# Patient Record
Sex: Female | Born: 1975
Health system: Southern US, Community
[De-identification: ages and names within clinical notes are randomized; demographics above are authoritative.]

---

## 2002-06-27 ENCOUNTER — Other Ambulatory Visit: Admission: RE | Admit: 2002-06-27 | Discharge: 2002-06-27 | Payer: Self-pay | Admitting: *Deleted

## 2003-06-17 ENCOUNTER — Other Ambulatory Visit: Admission: RE | Admit: 2003-06-17 | Discharge: 2003-06-17 | Payer: Self-pay | Admitting: *Deleted

## 2005-07-25 ENCOUNTER — Ambulatory Visit (HOSPITAL_COMMUNITY): Admission: RE | Admit: 2005-07-25 | Discharge: 2005-07-25 | Payer: Self-pay | Admitting: Orthopedic Surgery

## 2006-02-13 ENCOUNTER — Ambulatory Visit (HOSPITAL_COMMUNITY): Admission: RE | Admit: 2006-02-13 | Discharge: 2006-02-13 | Payer: Self-pay | Admitting: Orthopedic Surgery

## 2006-12-04 ENCOUNTER — Ambulatory Visit (HOSPITAL_COMMUNITY): Admission: RE | Admit: 2006-12-04 | Discharge: 2006-12-04 | Payer: Self-pay | Admitting: *Deleted

## 2006-12-04 ENCOUNTER — Encounter (INDEPENDENT_AMBULATORY_CARE_PROVIDER_SITE_OTHER): Payer: Self-pay | Admitting: Specialist

## 2007-07-01 ENCOUNTER — Emergency Department (HOSPITAL_COMMUNITY): Admission: EM | Admit: 2007-07-01 | Discharge: 2007-07-01 | Payer: Self-pay | Admitting: Emergency Medicine

## 2011-02-11 NOTE — Op Note (Signed)
Pamela Parker, Pamela Parker               ACCOUNT NO.:  [id number]   MEDICAL RECORD NO.:  [id number]          PATIENT TYPE:  AMB   LOCATION:  SDC                           FACILITY:  [facility]   PHYSICIAN:  [place] B. Earlene Plater, M.D.  DATE OF BIRTH:  [birth year]   DATE OF PROCEDURE:  12/04/2006  DATE OF DISCHARGE:                               OPERATIVE REPORT   PREOPERATIVE DIAGNOSIS:  Redundant left labial tissue.   POSTOPERATIVE DIAGNOSIS:  Redundant left labial tissue.   PROCEDURE:  Excision of redundant labial tissue.   SURGEON:  Chester Holstein. Earlene Plater, M.D.   ASSISTANT:  None.   ANESTHESIA:  MAC and 5 cc 0.25% Marcaine.   SPECIMENS:  Labial tissue to pathology.   ESTIMATED BLOOD LOSS:  Minimal.   COMPLICATIONS:  None.   INDICATIONS:  A patient with long history of redundant labial tissue.  This makes it difficult regarding hygiene, with toilet paper often  sticking, difficulty wearing tight fitting clothes causing significant  personal discomfort.  The patient is advised that excision of redundant  labial tissue is purely to reduce bulk of the tissue, and that the side  will not look the same as the right (which is unaltered). The patient is  aware of this and also of the possibility of dyspareunia.  The patient  is advised of the risks of surgery, including infection, bleeding ,  damage to surrounding organs and delayed healing.   DESCRIPTION OF PROCEDURE:  The patient was taken to the operating room  and MAC anesthesia obtained.  The labia are inspected.  The left labia  extended a good 2-3 cm distal as compared to the right.  The two labia  are placed on traction, and a marking pen used to mark the left labia to  approximate the configuration on the right.  This area was infiltrated  with 0.25% Marcaine and excised sharply.  The incision was closed on a  subcuticular stitch of 4-0 Vicryl, with hemostasis obtained.   The patient tolerated the procedure well with no complications.  She  was  taken to the recovery room awake, alert and in stable condition.  All  counts were correct per operating room staff.      Gerri Spore B. Earlene Plater, M.D.  Electronically Signed     WBD/MEDQ  D:  12/04/2006  T:  12/04/2006  Job:  [id number]

## 2011-07-07 LAB — URINALYSIS, ROUTINE W REFLEX MICROSCOPIC
Bilirubin Urine: NEGATIVE
Glucose, UA: NEGATIVE
Ketones, ur: NEGATIVE
Leukocytes, UA: NEGATIVE
Nitrite: NEGATIVE
Protein, ur: NEGATIVE
Specific Gravity, Urine: 1.014
Urobilinogen, UA: 0.2
pH: 7.5

## 2011-07-07 LAB — URINE MICROSCOPIC-ADD ON

## 2011-07-07 LAB — PREGNANCY, URINE: Preg Test, Ur: NEGATIVE

## 2014-01-02 ENCOUNTER — Other Ambulatory Visit (HOSPITAL_COMMUNITY): Payer: Self-pay | Admitting: Obstetrics

## 2014-01-02 DIAGNOSIS — N970 Female infertility associated with anovulation: Secondary | ICD-10-CM

## 2014-01-06 ENCOUNTER — Ambulatory Visit (HOSPITAL_COMMUNITY)
Admission: RE | Admit: 2014-01-06 | Discharge: 2014-01-06 | Disposition: A | Discharge status: Home / Self Care | Payer: 59 | Source: Ambulatory Visit | Attending: Obstetrics | Admitting: Obstetrics

## 2014-01-06 DIAGNOSIS — N970 Female infertility associated with anovulation: Secondary | ICD-10-CM

## 2014-01-06 DIAGNOSIS — N979 Female infertility, unspecified: Secondary | ICD-10-CM | POA: Insufficient documentation

## 2014-01-06 MED ORDER — IOHEXOL 300 MG/ML  SOLN
20.0000 mL | Freq: Once | INTRAMUSCULAR | Status: AC | PRN
  Administered 2014-01-06: 5 mL

## 2016-10-30 DIAGNOSIS — J209 Acute bronchitis, unspecified: Secondary | ICD-10-CM | POA: Diagnosis not present

## 2016-11-25 DIAGNOSIS — Z0000 Encounter for general adult medical examination without abnormal findings: Secondary | ICD-10-CM | POA: Diagnosis not present

## 2017-01-30 DIAGNOSIS — Z880 Allergy status to penicillin: Secondary | ICD-10-CM | POA: Diagnosis not present

## 2017-01-30 DIAGNOSIS — Z9889 Other specified postprocedural states: Secondary | ICD-10-CM | POA: Diagnosis not present

## 2017-01-30 DIAGNOSIS — Q2731 Arteriovenous malformation of vessel of upper limb: Secondary | ICD-10-CM | POA: Diagnosis not present

## 2017-01-30 DIAGNOSIS — Z8774 Personal history of (corrected) congenital malformations of heart and circulatory system: Secondary | ICD-10-CM | POA: Diagnosis not present

## 2017-01-30 DIAGNOSIS — M79641 Pain in right hand: Secondary | ICD-10-CM | POA: Diagnosis not present

## 2017-02-10 DIAGNOSIS — Z8774 Personal history of (corrected) congenital malformations of heart and circulatory system: Secondary | ICD-10-CM | POA: Diagnosis not present

## 2017-02-27 DIAGNOSIS — Z8774 Personal history of (corrected) congenital malformations of heart and circulatory system: Secondary | ICD-10-CM | POA: Diagnosis not present

## 2017-02-27 DIAGNOSIS — Z09 Encounter for follow-up examination after completed treatment for conditions other than malignant neoplasm: Secondary | ICD-10-CM | POA: Diagnosis not present

## 2017-03-01 DIAGNOSIS — Z8774 Personal history of (corrected) congenital malformations of heart and circulatory system: Secondary | ICD-10-CM | POA: Diagnosis not present

## 2017-05-01 DIAGNOSIS — K130 Diseases of lips: Secondary | ICD-10-CM | POA: Diagnosis not present

## 2017-05-01 DIAGNOSIS — D2262 Melanocytic nevi of left upper limb, including shoulder: Secondary | ICD-10-CM | POA: Diagnosis not present

## 2017-05-01 DIAGNOSIS — D239 Other benign neoplasm of skin, unspecified: Secondary | ICD-10-CM | POA: Diagnosis not present

## 2017-05-15 DIAGNOSIS — R946 Abnormal results of thyroid function studies: Secondary | ICD-10-CM | POA: Diagnosis not present

## 2017-05-19 DIAGNOSIS — E039 Hypothyroidism, unspecified: Secondary | ICD-10-CM | POA: Diagnosis not present

## 2017-05-19 DIAGNOSIS — Z8349 Family history of other endocrine, nutritional and metabolic diseases: Secondary | ICD-10-CM | POA: Diagnosis not present

## 2017-05-19 DIAGNOSIS — R6889 Other general symptoms and signs: Secondary | ICD-10-CM | POA: Diagnosis not present

## 2017-05-30 DIAGNOSIS — Z6821 Body mass index (BMI) 21.0-21.9, adult: Secondary | ICD-10-CM | POA: Diagnosis not present

## 2017-05-30 DIAGNOSIS — Z01419 Encounter for gynecological examination (general) (routine) without abnormal findings: Secondary | ICD-10-CM | POA: Diagnosis not present

## 2017-07-06 DIAGNOSIS — E039 Hypothyroidism, unspecified: Secondary | ICD-10-CM | POA: Diagnosis not present

## 2017-08-30 DIAGNOSIS — S29012A Strain of muscle and tendon of back wall of thorax, initial encounter: Secondary | ICD-10-CM | POA: Diagnosis not present

## 2017-08-30 DIAGNOSIS — M898X1 Other specified disorders of bone, shoulder: Secondary | ICD-10-CM | POA: Diagnosis not present

## 2017-08-30 DIAGNOSIS — R918 Other nonspecific abnormal finding of lung field: Secondary | ICD-10-CM | POA: Diagnosis not present

## 2017-09-01 DIAGNOSIS — E039 Hypothyroidism, unspecified: Secondary | ICD-10-CM | POA: Diagnosis not present

## 2017-09-05 DIAGNOSIS — R140 Abdominal distension (gaseous): Secondary | ICD-10-CM | POA: Diagnosis not present

## 2017-09-05 DIAGNOSIS — E039 Hypothyroidism, unspecified: Secondary | ICD-10-CM | POA: Diagnosis not present

## 2017-09-22 DIAGNOSIS — Z803 Family history of malignant neoplasm of breast: Secondary | ICD-10-CM | POA: Diagnosis not present

## 2017-09-22 DIAGNOSIS — R140 Abdominal distension (gaseous): Secondary | ICD-10-CM | POA: Diagnosis not present

## 2017-09-22 DIAGNOSIS — Z131 Encounter for screening for diabetes mellitus: Secondary | ICD-10-CM | POA: Diagnosis not present

## 2017-09-22 DIAGNOSIS — Z0000 Encounter for general adult medical examination without abnormal findings: Secondary | ICD-10-CM | POA: Diagnosis not present

## 2017-10-20 DIAGNOSIS — R140 Abdominal distension (gaseous): Secondary | ICD-10-CM | POA: Diagnosis not present

## 2017-11-06 DIAGNOSIS — R1010 Upper abdominal pain, unspecified: Secondary | ICD-10-CM | POA: Diagnosis not present

## 2017-11-06 DIAGNOSIS — R112 Nausea with vomiting, unspecified: Secondary | ICD-10-CM | POA: Diagnosis not present

## 2017-11-20 DIAGNOSIS — Z0000 Encounter for general adult medical examination without abnormal findings: Secondary | ICD-10-CM | POA: Diagnosis not present

## 2017-11-20 DIAGNOSIS — E039 Hypothyroidism, unspecified: Secondary | ICD-10-CM | POA: Diagnosis not present

## 2017-11-23 DIAGNOSIS — K5900 Constipation, unspecified: Secondary | ICD-10-CM | POA: Diagnosis not present

## 2017-11-23 DIAGNOSIS — R140 Abdominal distension (gaseous): Secondary | ICD-10-CM | POA: Diagnosis not present

## 2017-11-30 DIAGNOSIS — Z0000 Encounter for general adult medical examination without abnormal findings: Secondary | ICD-10-CM | POA: Diagnosis not present

## 2017-11-30 DIAGNOSIS — E039 Hypothyroidism, unspecified: Secondary | ICD-10-CM | POA: Diagnosis not present

## 2017-12-19 DIAGNOSIS — R140 Abdominal distension (gaseous): Secondary | ICD-10-CM | POA: Diagnosis not present

## 2017-12-19 DIAGNOSIS — K5900 Constipation, unspecified: Secondary | ICD-10-CM | POA: Diagnosis not present

## 2018-01-30 DIAGNOSIS — E039 Hypothyroidism, unspecified: Secondary | ICD-10-CM | POA: Diagnosis not present

## 2018-06-12 DIAGNOSIS — L239 Allergic contact dermatitis, unspecified cause: Secondary | ICD-10-CM | POA: Diagnosis not present

## 2018-06-12 DIAGNOSIS — R21 Rash and other nonspecific skin eruption: Secondary | ICD-10-CM | POA: Diagnosis not present

## 2018-06-12 DIAGNOSIS — J309 Allergic rhinitis, unspecified: Secondary | ICD-10-CM | POA: Diagnosis not present

## 2018-08-28 DIAGNOSIS — Z01419 Encounter for gynecological examination (general) (routine) without abnormal findings: Secondary | ICD-10-CM | POA: Diagnosis not present

## 2018-08-28 DIAGNOSIS — Z6821 Body mass index (BMI) 21.0-21.9, adult: Secondary | ICD-10-CM | POA: Diagnosis not present

## 2018-08-28 DIAGNOSIS — Z1231 Encounter for screening mammogram for malignant neoplasm of breast: Secondary | ICD-10-CM | POA: Diagnosis not present

## 2018-09-11 DIAGNOSIS — D259 Leiomyoma of uterus, unspecified: Secondary | ICD-10-CM | POA: Diagnosis not present

## 2018-09-11 DIAGNOSIS — N83201 Unspecified ovarian cyst, right side: Secondary | ICD-10-CM | POA: Diagnosis not present

## 2018-09-11 DIAGNOSIS — R1909 Other intra-abdominal and pelvic swelling, mass and lump: Secondary | ICD-10-CM | POA: Diagnosis not present

## 2018-10-24 DIAGNOSIS — R1909 Other intra-abdominal and pelvic swelling, mass and lump: Secondary | ICD-10-CM | POA: Diagnosis not present

## 2018-10-24 DIAGNOSIS — R140 Abdominal distension (gaseous): Secondary | ICD-10-CM | POA: Diagnosis not present

## 2018-10-24 DIAGNOSIS — D259 Leiomyoma of uterus, unspecified: Secondary | ICD-10-CM | POA: Diagnosis not present

## 2018-10-24 DIAGNOSIS — N83201 Unspecified ovarian cyst, right side: Secondary | ICD-10-CM | POA: Diagnosis not present

## 2018-11-19 DIAGNOSIS — Z8774 Personal history of (corrected) congenital malformations of heart and circulatory system: Secondary | ICD-10-CM | POA: Diagnosis not present

## 2018-11-28 DIAGNOSIS — Z0000 Encounter for general adult medical examination without abnormal findings: Secondary | ICD-10-CM | POA: Diagnosis not present

## 2018-11-28 DIAGNOSIS — E7800 Pure hypercholesterolemia, unspecified: Secondary | ICD-10-CM | POA: Diagnosis not present

## 2018-11-28 DIAGNOSIS — E559 Vitamin D deficiency, unspecified: Secondary | ICD-10-CM | POA: Diagnosis not present

## 2018-12-03 DIAGNOSIS — Z0000 Encounter for general adult medical examination without abnormal findings: Secondary | ICD-10-CM | POA: Diagnosis not present

## 2019-08-20 DIAGNOSIS — Z03818 Encounter for observation for suspected exposure to other biological agents ruled out: Secondary | ICD-10-CM | POA: Diagnosis not present

## 2019-08-20 DIAGNOSIS — Z20828 Contact with and (suspected) exposure to other viral communicable diseases: Secondary | ICD-10-CM | POA: Diagnosis not present

## 2019-09-02 ENCOUNTER — Other Ambulatory Visit: Payer: Self-pay | Admitting: Obstetrics

## 2019-09-02 DIAGNOSIS — Z1231 Encounter for screening mammogram for malignant neoplasm of breast: Secondary | ICD-10-CM | POA: Diagnosis not present

## 2019-09-02 DIAGNOSIS — Z1329 Encounter for screening for other suspected endocrine disorder: Secondary | ICD-10-CM | POA: Diagnosis not present

## 2019-09-02 DIAGNOSIS — R630 Anorexia: Secondary | ICD-10-CM | POA: Diagnosis not present

## 2019-09-02 DIAGNOSIS — N6310 Unspecified lump in the right breast, unspecified quadrant: Secondary | ICD-10-CM

## 2019-09-02 DIAGNOSIS — Z6820 Body mass index (BMI) 20.0-20.9, adult: Secondary | ICD-10-CM | POA: Diagnosis not present

## 2019-09-02 DIAGNOSIS — Z0000 Encounter for general adult medical examination without abnormal findings: Secondary | ICD-10-CM | POA: Diagnosis not present

## 2019-09-02 DIAGNOSIS — Z01419 Encounter for gynecological examination (general) (routine) without abnormal findings: Secondary | ICD-10-CM | POA: Diagnosis not present

## 2019-09-02 DIAGNOSIS — Z23 Encounter for immunization: Secondary | ICD-10-CM | POA: Diagnosis not present

## 2019-09-05 ENCOUNTER — Ambulatory Visit
Admission: RE | Admit: 2019-09-05 | Discharge: 2019-09-05 | Disposition: A | Discharge status: Home / Self Care | Payer: BC Managed Care – PPO | Source: Ambulatory Visit | Attending: Obstetrics | Admitting: Obstetrics

## 2019-09-05 ENCOUNTER — Other Ambulatory Visit: Payer: Self-pay

## 2019-09-05 ENCOUNTER — Other Ambulatory Visit: Payer: Self-pay | Admitting: Obstetrics

## 2019-09-05 DIAGNOSIS — N6310 Unspecified lump in the right breast, unspecified quadrant: Secondary | ICD-10-CM

## 2019-09-05 DIAGNOSIS — R921 Mammographic calcification found on diagnostic imaging of breast: Secondary | ICD-10-CM | POA: Diagnosis not present

## 2019-09-05 DIAGNOSIS — N6001 Solitary cyst of right breast: Secondary | ICD-10-CM | POA: Diagnosis not present

## 2019-09-09 DIAGNOSIS — Z20818 Contact with and (suspected) exposure to other bacterial communicable diseases: Secondary | ICD-10-CM | POA: Diagnosis not present

## 2019-12-06 DIAGNOSIS — Z0000 Encounter for general adult medical examination without abnormal findings: Secondary | ICD-10-CM | POA: Diagnosis not present

## 2019-12-06 DIAGNOSIS — E7800 Pure hypercholesterolemia, unspecified: Secondary | ICD-10-CM | POA: Diagnosis not present

## 2019-12-06 DIAGNOSIS — E039 Hypothyroidism, unspecified: Secondary | ICD-10-CM | POA: Diagnosis not present

## 2019-12-06 DIAGNOSIS — E559 Vitamin D deficiency, unspecified: Secondary | ICD-10-CM | POA: Diagnosis not present

## 2019-12-06 DIAGNOSIS — N390 Urinary tract infection, site not specified: Secondary | ICD-10-CM | POA: Diagnosis not present

## 2019-12-10 DIAGNOSIS — Z0000 Encounter for general adult medical examination without abnormal findings: Secondary | ICD-10-CM | POA: Diagnosis not present

## 2020-01-14 DIAGNOSIS — R140 Abdominal distension (gaseous): Secondary | ICD-10-CM | POA: Diagnosis not present

## 2020-01-16 DIAGNOSIS — Z1159 Encounter for screening for other viral diseases: Secondary | ICD-10-CM | POA: Diagnosis not present

## 2020-01-20 DIAGNOSIS — K3189 Other diseases of stomach and duodenum: Secondary | ICD-10-CM | POA: Diagnosis not present

## 2020-01-20 DIAGNOSIS — K259 Gastric ulcer, unspecified as acute or chronic, without hemorrhage or perforation: Secondary | ICD-10-CM | POA: Diagnosis not present

## 2020-01-20 DIAGNOSIS — R140 Abdominal distension (gaseous): Secondary | ICD-10-CM | POA: Diagnosis not present

## 2020-02-10 DIAGNOSIS — L821 Other seborrheic keratosis: Secondary | ICD-10-CM | POA: Diagnosis not present

## 2020-02-10 DIAGNOSIS — L111 Transient acantholytic dermatosis [Grover]: Secondary | ICD-10-CM | POA: Diagnosis not present

## 2020-03-06 ENCOUNTER — Other Ambulatory Visit: Payer: Self-pay | Admitting: Obstetrics

## 2020-03-06 ENCOUNTER — Other Ambulatory Visit: Payer: Self-pay

## 2020-03-06 ENCOUNTER — Ambulatory Visit
Admission: RE | Admit: 2020-03-06 | Discharge: 2020-03-06 | Disposition: A | Discharge status: Home / Self Care | Payer: BC Managed Care – PPO | Source: Ambulatory Visit | Attending: Obstetrics | Admitting: Obstetrics

## 2020-03-06 DIAGNOSIS — N6311 Unspecified lump in the right breast, upper outer quadrant: Secondary | ICD-10-CM | POA: Diagnosis not present

## 2020-03-06 DIAGNOSIS — N6310 Unspecified lump in the right breast, unspecified quadrant: Secondary | ICD-10-CM

## 2020-03-16 DIAGNOSIS — A09 Infectious gastroenteritis and colitis, unspecified: Secondary | ICD-10-CM | POA: Diagnosis not present

## 2020-03-16 DIAGNOSIS — R197 Diarrhea, unspecified: Secondary | ICD-10-CM | POA: Diagnosis not present

## 2020-03-16 DIAGNOSIS — R509 Fever, unspecified: Secondary | ICD-10-CM | POA: Diagnosis not present

## 2020-03-18 DIAGNOSIS — R197 Diarrhea, unspecified: Secondary | ICD-10-CM | POA: Diagnosis not present

## 2020-03-18 DIAGNOSIS — R509 Fever, unspecified: Secondary | ICD-10-CM | POA: Diagnosis not present

## 2020-03-18 DIAGNOSIS — A09 Infectious gastroenteritis and colitis, unspecified: Secondary | ICD-10-CM | POA: Diagnosis not present

## 2020-03-19 DIAGNOSIS — D72819 Decreased white blood cell count, unspecified: Secondary | ICD-10-CM | POA: Diagnosis not present

## 2020-03-21 ENCOUNTER — Emergency Department (HOSPITAL_BASED_OUTPATIENT_CLINIC_OR_DEPARTMENT_OTHER)
Admission: EM | Admit: 2020-03-21 | Discharge: 2020-03-21 | Disposition: A | Discharge status: Home / Self Care | Payer: BC Managed Care – PPO | Attending: Emergency Medicine | Admitting: Emergency Medicine

## 2020-03-21 ENCOUNTER — Other Ambulatory Visit: Payer: Self-pay

## 2020-03-21 ENCOUNTER — Encounter (HOSPITAL_BASED_OUTPATIENT_CLINIC_OR_DEPARTMENT_OTHER): Payer: Self-pay | Admitting: Emergency Medicine

## 2020-03-21 DIAGNOSIS — D72819 Decreased white blood cell count, unspecified: Secondary | ICD-10-CM

## 2020-03-21 DIAGNOSIS — R197 Diarrhea, unspecified: Secondary | ICD-10-CM | POA: Diagnosis not present

## 2020-03-21 LAB — COMPREHENSIVE METABOLIC PANEL
ALT: 16 U/L (ref 0–44)
AST: 25 U/L (ref 15–41)
Albumin: 3.9 g/dL (ref 3.5–5.0)
Alkaline Phosphatase: 41 U/L (ref 38–126)
Anion gap: 10 (ref 5–15)
BUN: 8 mg/dL (ref 6–20)
CO2: 25 mmol/L (ref 22–32)
Calcium: 8.3 mg/dL — ABNORMAL LOW (ref 8.9–10.3)
Chloride: 102 mmol/L (ref 98–111)
Creatinine, Ser: 0.61 mg/dL (ref 0.44–1.00)
GFR calc Af Amer: >60 mL/min (ref >60)
GFR calc non Af Amer: >60 mL/min (ref >60)
Glucose, Bld: 84 mg/dL (ref 70–99)
Potassium: 3.4 mmol/L — ABNORMAL LOW (ref 3.5–5.1)
Sodium: 137 mmol/L (ref 135–145)
Total Bilirubin: 0.6 mg/dL (ref 0.3–1.2)
Total Protein: 6.5 g/dL (ref 6.5–8.1)

## 2020-03-21 LAB — URINALYSIS, ROUTINE W REFLEX MICROSCOPIC
Bilirubin Urine: NEGATIVE
Glucose, UA: NEGATIVE mg/dL
Ketones, ur: 15 mg/dL — AB
Nitrite: NEGATIVE
Protein, ur: NEGATIVE mg/dL
Specific Gravity, Urine: 1.01 (ref 1.005–1.030)
pH: 6.5 (ref 5.0–8.0)

## 2020-03-21 LAB — URINALYSIS, MICROSCOPIC (REFLEX)

## 2020-03-21 LAB — CBC
HCT: 38.5 % (ref 36.0–46.0)
Hemoglobin: 12.6 g/dL (ref 12.0–15.0)
MCH: 29.2 pg (ref 26.0–34.0)
MCHC: 32.7 g/dL (ref 30.0–36.0)
MCV: 89.3 fL (ref 80.0–100.0)
Platelets: 152 10*3/uL (ref 150–400)
RBC: 4.31 MIL/uL (ref 3.87–5.11)
RDW: 12.2 % (ref 11.5–15.5)
WBC: 2 10*3/uL — ABNORMAL LOW (ref 4.0–10.5)
nRBC: 0 % (ref 0.0–0.2)

## 2020-03-21 LAB — PREGNANCY, URINE: Preg Test, Ur: NEGATIVE

## 2020-03-21 LAB — LIPASE, BLOOD: Lipase: 38 U/L (ref 11–51)

## 2020-03-21 MED ORDER — SODIUM CHLORIDE 0.9 % IV BOLUS
1000.0000 mL | Freq: Once | INTRAVENOUS | Status: AC
  Administered 2020-03-21: 1000 mL via INTRAVENOUS

## 2020-03-21 NOTE — Discharge Instructions (Signed)
Continue using Imodium as needed for diarrhea. Make sure you are staying well-hydrated with water. Be careful with your diet, this can affect your diarrhea symptoms.  There is information about this in the paperwork. If you continue to have diarrhea, follow-up with the GI doctor. Return to the emergency room if you develop high fevers, persistent vomiting, severe worsening abdominal pain, bloody stools, orany new, worsening, concerning symptoms.

## 2020-03-21 NOTE — ED Notes (Addendum)
Pt states that she is tolerating the fluids well and ready to go

## 2020-03-21 NOTE — ED Triage Notes (Addendum)
Diarrhea, fever, body aches. Just returned from [place]. Had blood work done this week at PCP and gave a stool sample.

## 2020-03-21 NOTE — ED Provider Notes (Signed)
Pamela Parker   CSN: [id number] Arrival date & time: 03/21/20  1437     History Chief Complaint  Patient presents with  . Diarrhea    Summar Pamela Parker is a 44 y.o. female presenting for evaluation of diarrhea and lightheadedness.  Patient states the past week, she has been having persistent diarrhea.  Initially, she had associated nausea, vomiting, and fevers, but these have since resolved.  She reports 2 episodes of loose stool a day.  There is no blood in her stool.  She has seen her PCP twice for this, found to have leukopenia but otherwise reassuring labs.  Patient states yesterday and today she was having intermittent lightheadedness, worse when standing, improved with sitting or laying down.  She is not feeling lightheaded currently.  She denies abdominal pain or urinary symptoms.  She denies previous history of similar abdominal problems.  No family history of IBS, IBD, Crohn's, UC.  She saw Dr. Therisa Doyne with GI several months ago for an EGD, found to have a small peptic ulcer, but otherwise exam was negative.  Patient reports her symptoms began after she got back from [place].  She reports associated mild dry cough.  She has been tested for Covid x2, both were negative.  She denies sick contacts.  She has no other medical problems, takes no medications daily.  HPI     History reviewed. No pertinent past medical history.  There are no problems to display for this patient.   History reviewed. No pertinent surgical history.   OB History   No obstetric history on file.     Family History  Problem Relation Age of Onset  . Breast cancer Mother 70    Social History   Tobacco Use  . Smoking status: Never Smoker  . Smokeless tobacco: Never Used  Substance Use Topics  . Alcohol use: Yes  . Drug use: Not on file    Home Medications Prior to Admission medications   Not on File    Allergies    Penicillins  Review of Systems     Review of Systems  Constitutional: Positive for fever (resolved).  Respiratory: Positive for cough.   Gastrointestinal: Positive for diarrhea.  Neurological: Positive for light-headedness (intermittent).  All other systems reviewed and are negative.   Physical Exam Updated Vital Signs BP 129/87 (BP Location: Left Arm)   Pulse 67   Temp 98.8 F (37.1 C) (Oral)   Resp 16   Ht 5\' 2"  (1.575 m)   Wt 49.9 kg   LMP 03/11/2020   SpO2 100%   BMI 20.12 kg/m   Physical Exam Vitals and nursing Parker reviewed.  Constitutional:      General: She is not in acute distress.    Appearance: She is well-developed.     Comments: Appears nontoxic  HENT:     Head: Normocephalic and atraumatic.     Mouth/Throat:     Mouth: Mucous membranes are moist.  Eyes:     Conjunctiva/sclera: Conjunctivae normal.     Pupils: Pupils are equal, round, and reactive to light.  Cardiovascular:     Rate and Rhythm: Normal rate and regular rhythm.     Pulses: Normal pulses.  Pulmonary:     Effort: Pulmonary effort is normal. No respiratory distress.     Breath sounds: Normal breath sounds. No wheezing.  Abdominal:     General: There is no distension.     Palpations: Abdomen is soft. There is no  mass.     Tenderness: There is no abdominal tenderness. There is no guarding or rebound.     Comments: No ttp of the abd  Musculoskeletal:        General: Normal range of motion.     Cervical back: Normal range of motion and neck supple.  Skin:    General: Skin is warm and dry.     Capillary Refill: Capillary refill takes less than 2 seconds.  Neurological:     Mental Status: She is alert and oriented to person, place, and time.     ED Results / Procedures / Treatments   Labs (all labs ordered are listed, but only abnormal results are displayed) Labs Reviewed  COMPREHENSIVE METABOLIC PANEL - Abnormal; Notable for the following components:      Result Value   Potassium 3.4 (*)    Calcium 8.3 (*)    All  other components within normal limits  CBC - Abnormal; Notable for the following components:   WBC 2.0 (*)    All other components within normal limits  URINALYSIS, ROUTINE W REFLEX MICROSCOPIC - Abnormal; Notable for the following components:   Hgb urine dipstick TRACE (*)    Ketones, ur 15 (*)    Leukocytes,Ua TRACE (*)    All other components within normal limits  URINALYSIS, MICROSCOPIC (REFLEX) - Abnormal; Notable for the following components:   Bacteria, UA FEW (*)    All other components within normal limits  LIPASE, BLOOD  PREGNANCY, URINE    EKG None  Radiology No results found.  Procedures Procedures (including critical care time)  Medications Ordered in ED Medications  sodium chloride 0.9 % bolus 1,000 mL (1,000 mLs Intravenous New Bag/Given 03/21/20 1637)    ED Course  I have reviewed the triage vital signs and the nursing notes.  Pertinent labs & imaging results that were available during my care of the patient were reviewed by me and considered in my medical decision making (see chart for details).    MDM Rules/Calculators/A&P                          Patient presenting for evaluation of persistent diarrhea.  Initially had fevers and associated nausea and vomiting, but this is since resolved.  On exam, patient appears nontoxic.  No abdominal tenderness.  Vitals are stable.  Mucous membranes moist.  Likely persistent diarrhea in the setting of viral illness.  However due to new intermittent lightheadedness, worse with standing, will give fluids and check labs to assess for electrolyte abnormalities.  Labs interpreted by me, overall reassuring. Leukopenia, similar to what patient reported previously.  Electrolytes are stable.  Orthostatics negative.  Patient reports improvement of symptoms after fluids.  She is tolerating p.o. without difficulty.  In the setting of no abdominal pain, resolved fever, and improved nausea/vomiting, I have low suspicion for  intra-abdominal infection, perforation, obstruction, or surgical abdomen.  I do not believe CT scan would be beneficial today.  Discussed continued dietary control and use of Imodium as needed.  Discussed importance of hydration.  Encourage follow-up with primary care and/or GI as needed for further evaluation.  At this time, patient appears safe for discharge.  Return precautions given.  Patient states she understands and agrees to plan.  Final Clinical Impression(s) / ED Diagnoses Final diagnoses:  Diarrhea, unspecified type  Leukopenia, unspecified type    Rx / DC Orders ED Discharge Orders    None  Franchot Heidelberg, PA-C 03/21/20 1906    Little, Wenda Overland, MD 03/23/20 1601

## 2020-03-24 DIAGNOSIS — D72819 Decreased white blood cell count, unspecified: Secondary | ICD-10-CM | POA: Diagnosis not present

## 2020-03-24 DIAGNOSIS — R197 Diarrhea, unspecified: Secondary | ICD-10-CM | POA: Diagnosis not present

## 2020-03-24 DIAGNOSIS — R05 Cough: Secondary | ICD-10-CM | POA: Diagnosis not present

## 2020-04-16 DIAGNOSIS — L308 Other specified dermatitis: Secondary | ICD-10-CM | POA: Diagnosis not present

## 2020-04-16 DIAGNOSIS — D2361 Other benign neoplasm of skin of right upper limb, including shoulder: Secondary | ICD-10-CM | POA: Diagnosis not present

## 2020-04-17 ENCOUNTER — Other Ambulatory Visit: Payer: Self-pay | Admitting: Family

## 2020-04-17 DIAGNOSIS — D72819 Decreased white blood cell count, unspecified: Secondary | ICD-10-CM

## 2020-04-20 ENCOUNTER — Telehealth: Payer: Self-pay | Admitting: Family

## 2020-04-20 ENCOUNTER — Encounter: Payer: Self-pay | Admitting: Family

## 2020-04-20 ENCOUNTER — Other Ambulatory Visit: Payer: Self-pay

## 2020-04-20 ENCOUNTER — Inpatient Hospital Stay: Payer: BC Managed Care – PPO

## 2020-04-20 ENCOUNTER — Inpatient Hospital Stay: Payer: BC Managed Care – PPO | Attending: Family | Admitting: Family

## 2020-04-20 VITALS — BP 133/80 | HR 67 | Temp 98.0°F | Resp 18 | Ht 61.0 in | Wt 114.4 lb

## 2020-04-20 DIAGNOSIS — D72819 Decreased white blood cell count, unspecified: Secondary | ICD-10-CM | POA: Insufficient documentation

## 2020-04-20 DIAGNOSIS — R21 Rash and other nonspecific skin eruption: Secondary | ICD-10-CM | POA: Diagnosis not present

## 2020-04-20 DIAGNOSIS — E039 Hypothyroidism, unspecified: Secondary | ICD-10-CM | POA: Insufficient documentation

## 2020-04-20 DIAGNOSIS — Z79899 Other long term (current) drug therapy: Secondary | ICD-10-CM | POA: Insufficient documentation

## 2020-04-20 LAB — CMP (CANCER CENTER ONLY)
ALT: 8 U/L (ref 0–44)
AST: 12 U/L — ABNORMAL LOW (ref 15–41)
Albumin: 4.5 g/dL (ref 3.5–5.0)
Alkaline Phosphatase: 46 U/L (ref 38–126)
Anion gap: 8 (ref 5–15)
BUN: 10 mg/dL (ref 6–20)
CO2: 27 mmol/L (ref 22–32)
Calcium: 9.2 mg/dL (ref 8.9–10.3)
Chloride: 104 mmol/L (ref 98–111)
Creatinine: 0.82 mg/dL (ref 0.44–1.00)
GFR, Est AFR Am: >60 mL/min (ref >60)
GFR, Estimated: >60 mL/min (ref >60)
Glucose, Bld: 95 mg/dL (ref 70–99)
Potassium: 3.9 mmol/L (ref 3.5–5.1)
Sodium: 139 mmol/L (ref 135–145)
Total Bilirubin: 0.5 mg/dL (ref 0.3–1.2)
Total Protein: 6.8 g/dL (ref 6.5–8.1)

## 2020-04-20 LAB — SAVE SMEAR(SSMR), FOR PROVIDER SLIDE REVIEW

## 2020-04-20 LAB — CBC WITH DIFFERENTIAL (CANCER CENTER ONLY)
Abs Immature Granulocytes: 0.01 10*3/uL (ref 0.00–0.07)
Basophils Absolute: 0 10*3/uL (ref 0.0–0.1)
Basophils Relative: 1 %
Eosinophils Absolute: 0.1 10*3/uL (ref 0.0–0.5)
Eosinophils Relative: 3 %
HCT: 37.5 % (ref 36.0–46.0)
Hemoglobin: 12.1 g/dL (ref 12.0–15.0)
Immature Granulocytes: 0 %
Lymphocytes Relative: 35 %
Lymphs Abs: 1.3 10*3/uL (ref 0.7–4.0)
MCH: 29.3 pg (ref 26.0–34.0)
MCHC: 32.3 g/dL (ref 30.0–36.0)
MCV: 90.8 fL (ref 80.0–100.0)
Monocytes Absolute: 0.3 10*3/uL (ref 0.1–1.0)
Monocytes Relative: 7 %
Neutro Abs: 2.1 10*3/uL (ref 1.7–7.7)
Neutrophils Relative %: 54 %
Platelet Count: 231 10*3/uL (ref 150–400)
RBC: 4.13 MIL/uL (ref 3.87–5.11)
RDW: 13.9 % (ref 11.5–15.5)
WBC Count: 3.9 10*3/uL — ABNORMAL LOW (ref 4.0–10.5)
nRBC: 0 % (ref 0.0–0.2)

## 2020-04-20 LAB — LACTATE DEHYDROGENASE: LDH: 148 U/L (ref 98–192)

## 2020-04-20 NOTE — Progress Notes (Signed)
Hematology/Oncology Consultation   Name: Pamela Parker      MRN: [id number]    Location: Room/bed info not found  Date: 04/20/2020 Time:9:11 AM   REFERRING PHYSICIAN:  Janie Morning, DO  REASON FOR CONSULT: Decreased white blood cell count   DIAGNOSIS: Leukopenia   HISTORY OF PRESENT ILLNESS: Pamela Parker is a very pleasant caucasian female with recent low WBC counts. A little over a month ago her WBC count was 2.5 and then dropped to 2.0 at recheck.  Around that time she had just returned from [place] and had an E.Coli infection. She had been symptomatic with n/v/d, 12 lb weight loss and sweats. She was treated and is now feeling much better. Her symptoms have all resolved and she has no complaints at this time.  Today her WBC count was 3.9 and differential is unremarkable.  She has no issue with recurrent infections. No fever, chills, n/v, cough, rash, dizziness, SOB, chest pain, palpitations, abdominal pain or changes in bowel or bladder habits.  She has a rash that will occasionally come out on her chest. She states that her dermatologist follows this and feels it is a Grover's rash.  No swelling, tenderness, numbness or tingling in her extremities.  No falls or syncope.  She has a half brother (paternal) that has history of leukemia as well as a nephew with history of leukemia. Her mother has history of breast cancer.  She had her mammogram and states that she has dense breasts. She is due again in December.  She states that her cycle is regular and not heavy. She does have history of uterine fibroids.  She has never been pregnant or had a miscarriage.  She has hypothyroidism and takes levothyroxine daily. She states that this has been well controlled.  She has maintained a good appetite and is staying well hydrated. Her weight is stable.  No smoking or recreational drug use. She has the occasional alcoholic beverage socially.  She is originally from [place] and moved to [place] when she was 44  yo.  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  She is active and enjoys walking and also doing 10 minute interval cardio workouts.   ROS: All other 10 point review of systems is negative.   PAST MEDICAL HISTORY:   No past medical history on file.  ALLERGIES: Allergies  Allergen Reactions  . Penicillins Rash      MEDICATIONS:  Current Outpatient Medications on File Prior to Visit  Medication Sig Dispense Refill  . levothyroxine (SYNTHROID) 25 MCG tablet Take 25 mcg by mouth daily.     No current facility-administered medications on file prior to visit.     PAST SURGICAL HISTORY No past surgical history on file.  FAMILY HISTORY: Family History  Problem Relation Age of Onset  . Breast cancer Mother 18    SOCIAL HISTORY:  reports that she has never smoked. She has never used smokeless tobacco. She reports current alcohol use. No history on file for drug use.  PERFORMANCE STATUS: The patient's performance status is 0 - Asymptomatic  PHYSICAL EXAM: Most Recent Vital Signs: Blood pressure (!) 133/80, pulse 67, temperature 98 F (36.7 C), temperature source Oral, resp. rate 18, height 5\' 1"  (1.549 m), weight 114 lb 6.4 oz (51.9 kg), SpO2 100 %. BP (!) 133/80 (BP Location: Left Arm, Patient Position: Sitting)   Pulse 67   Temp 98 F (36.7 C) (Oral)   Resp 18   Ht  5\' 1"  (1.549 m)   Wt 114 lb 6.4 oz (51.9 kg)   SpO2 100%   BMI 21.62 kg/m   General Appearance:    Alert, cooperative, no distress, appears stated age  Head:    Normocephalic, without obvious abnormality, atraumatic  Eyes:    PERRL, conjunctiva/corneas clear, EOM's intact, fundi    benign, both eyes        Throat:   Lips, mucosa, and tongue normal; teeth and gums normal  Neck:   Supple, symmetrical, trachea midline, no adenopathy;    thyroid:  no enlargement/tenderness/nodules; no carotid   bruit or JVD  Back:     Symmetric, no curvature, ROM normal, no CVA tenderness  Lungs:      Clear to auscultation bilaterally, respirations unlabored  Chest Wall:    No tenderness or deformity   Heart:    Regular rate and rhythm, S1 and S2 normal, no murmur, rub   or gallop     Abdomen:     Soft, non-tender, bowel sounds active all four quadrants,    no masses, no organomegaly        Extremities:   Extremities normal, atraumatic, no cyanosis or edema  Pulses:   2+ and symmetric all extremities  Skin:   Skin color, texture, turgor normal, no rashes or lesions  Lymph nodes:   Cervical, supraclavicular, and axillary nodes normal  Neurologic:   CNII-XII intact, normal strength, sensation and reflexes    throughout    LABORATORY DATA:  Results for orders placed or performed in visit on 04/20/20 (from the past 48 hour(s))  CBC with Differential (Cancer Center Only)     Status: Abnormal   Collection Time: 04/20/20  8:30 AM  Result Value Ref Range   WBC Count 3.9 (L) 4.0 - 10.5 K/uL   RBC 4.13 3.87 - 5.11 MIL/uL   Hemoglobin 12.1 12.0 - 15.0 g/dL   HCT 37.5 36 - 46 %   MCV 90.8 80.0 - 100.0 fL   MCH 29.3 26.0 - 34.0 pg   MCHC 32.3 30.0 - 36.0 g/dL   RDW 13.9 11.5 - 15.5 %   Platelet Count 231 150 - 400 K/uL   nRBC 0.0 0.0 - 0.2 %   Neutrophils Relative % 54 %   Neutro Abs 2.1 1.7 - 7.7 K/uL   Lymphocytes Relative 35 %   Lymphs Abs 1.3 0.7 - 4.0 K/uL   Monocytes Relative 7 %   Monocytes Absolute 0.3 0 - 1 K/uL   Eosinophils Relative 3 %   Eosinophils Absolute 0.1 0 - 0 K/uL   Basophils Relative 1 %   Basophils Absolute 0.0 0 - 0 K/uL   Immature Granulocytes 0 %   Abs Immature Granulocytes 0.01 0.00 - 0.07 K/uL    Comment: Performed at [facility] at [facility], [address], [place], [place] [address]  CMP ([place] only)     Status: Abnormal   Collection Time: 04/20/20  8:30 AM  Result Value Ref Range   Sodium 139 135 - 145 mmol/L   Potassium 3.9 3.5 - 5.1 mmol/L   Chloride 104 98 - 111 mmol/L   CO2 27 22 - 32 mmol/L   Glucose,  Bld 95 70 - 99 mg/dL    Comment: Glucose reference range applies only to samples taken after fasting for at least 8 hours.   BUN 10 6 - 20 mg/dL   Creatinine 0.82 0.44 - 1.00 mg/dL  Calcium 9.2 8.9 - 10.3 mg/dL   Total Protein 6.8 6.5 - 8.1 g/dL   Albumin 4.5 3.5 - 5.0 g/dL   AST 12 (L) 15 - 41 U/L   ALT 8 0 - 44 U/L   Alkaline Phosphatase 46 38 - 126 U/L   Total Bilirubin 0.5 0.3 - 1.2 mg/dL   GFR, Est Non Af Am >60 >60 mL/min   GFR, Est AFR Am >60 >60 mL/min   Anion gap 8 5 - 15    Comment: Performed at [facility] Lab at [facility], [address] [place], [place] [address]  [facility] [employer])     Status: None   Collection Time: 04/20/20  8:30 AM  Result Value Ref Range   Smear Review SMEAR STAINED AND AVAILABLE FOR REVIEW     Comment: Performed at [facility] Lab at [facility], [address] [place], [place] [address]      RADIOGRAPHY: No results found.     PATHOLOGY: None  ASSESSMENT/PLAN: Ms. Soule is a very pleasant caucasian female with recent low WBC counts during E.coli infection. This seems to be resolving and WBC count is now back up to 3.9.  We will plan to see her again in 4 months and repeat lab work. If everything remains stable at that time we will let her go from our office.   All questions were answered and she is in agreement with the plan. She can contact our office with any questions or concerns. We can certainly see her sooner if needed.   She was discussed with Dr. Marin Olp and he is in agreement with the aforementioned.   Laverna Peace, NP

## 2020-04-20 NOTE — Telephone Encounter (Signed)
Appointments scheduled calendar printed & mailed per 7/26 los

## 2020-06-04 DIAGNOSIS — R946 Abnormal results of thyroid function studies: Secondary | ICD-10-CM | POA: Diagnosis not present

## 2020-07-06 DIAGNOSIS — H93293 Other abnormal auditory perceptions, bilateral: Secondary | ICD-10-CM | POA: Diagnosis not present

## 2020-08-03 DIAGNOSIS — R946 Abnormal results of thyroid function studies: Secondary | ICD-10-CM | POA: Diagnosis not present

## 2020-08-28 ENCOUNTER — Inpatient Hospital Stay: Payer: BC Managed Care – PPO | Admitting: Family

## 2020-08-28 ENCOUNTER — Inpatient Hospital Stay: Payer: BC Managed Care – PPO | Attending: Registered Nurse

## 2020-09-07 ENCOUNTER — Ambulatory Visit
Admission: RE | Admit: 2020-09-07 | Discharge: 2020-09-07 | Disposition: A | Discharge status: Home / Self Care | Payer: BC Managed Care – PPO | Source: Ambulatory Visit | Attending: Obstetrics | Admitting: Obstetrics

## 2020-09-07 ENCOUNTER — Other Ambulatory Visit: Payer: Self-pay

## 2020-09-07 DIAGNOSIS — N6310 Unspecified lump in the right breast, unspecified quadrant: Secondary | ICD-10-CM

## 2020-09-07 DIAGNOSIS — R922 Inconclusive mammogram: Secondary | ICD-10-CM | POA: Diagnosis not present

## 2020-09-07 DIAGNOSIS — N6311 Unspecified lump in the right breast, upper outer quadrant: Secondary | ICD-10-CM | POA: Diagnosis not present

## 2020-09-15 DIAGNOSIS — Z20828 Contact with and (suspected) exposure to other viral communicable diseases: Secondary | ICD-10-CM | POA: Diagnosis not present

## 2020-09-15 DIAGNOSIS — J069 Acute upper respiratory infection, unspecified: Secondary | ICD-10-CM | POA: Diagnosis not present

## 2020-09-15 DIAGNOSIS — R051 Acute cough: Secondary | ICD-10-CM | POA: Diagnosis not present

## 2020-09-22 DIAGNOSIS — Z20822 Contact with and (suspected) exposure to covid-19: Secondary | ICD-10-CM | POA: Diagnosis not present

## 2020-10-13 DIAGNOSIS — Z3169 Encounter for other general counseling and advice on procreation: Secondary | ICD-10-CM | POA: Diagnosis not present

## 2020-10-26 DIAGNOSIS — N96 Recurrent pregnancy loss: Secondary | ICD-10-CM | POA: Diagnosis not present

## 2020-11-05 DIAGNOSIS — Z3169 Encounter for other general counseling and advice on procreation: Secondary | ICD-10-CM | POA: Diagnosis not present

## 2020-11-05 DIAGNOSIS — N979 Female infertility, unspecified: Secondary | ICD-10-CM | POA: Diagnosis not present

## 2020-11-18 DIAGNOSIS — Z3202 Encounter for pregnancy test, result negative: Secondary | ICD-10-CM | POA: Diagnosis not present

## 2020-11-18 DIAGNOSIS — D259 Leiomyoma of uterus, unspecified: Secondary | ICD-10-CM | POA: Diagnosis not present

## 2020-12-06 DIAGNOSIS — Z20822 Contact with and (suspected) exposure to covid-19: Secondary | ICD-10-CM | POA: Diagnosis not present

## 2020-12-08 DIAGNOSIS — Z880 Allergy status to penicillin: Secondary | ICD-10-CM | POA: Diagnosis not present

## 2020-12-08 DIAGNOSIS — G8918 Other acute postprocedural pain: Secondary | ICD-10-CM | POA: Diagnosis not present

## 2020-12-08 DIAGNOSIS — R1084 Generalized abdominal pain: Secondary | ICD-10-CM | POA: Diagnosis not present

## 2020-12-08 DIAGNOSIS — N938 Other specified abnormal uterine and vaginal bleeding: Secondary | ICD-10-CM | POA: Diagnosis not present

## 2020-12-08 DIAGNOSIS — D259 Leiomyoma of uterus, unspecified: Secondary | ICD-10-CM | POA: Diagnosis not present

## 2020-12-08 DIAGNOSIS — N921 Excessive and frequent menstruation with irregular cycle: Secondary | ICD-10-CM | POA: Diagnosis not present

## 2020-12-08 DIAGNOSIS — R102 Pelvic and perineal pain: Secondary | ICD-10-CM | POA: Diagnosis not present

## 2020-12-08 DIAGNOSIS — Z9889 Other specified postprocedural states: Secondary | ICD-10-CM | POA: Diagnosis not present

## 2020-12-09 DIAGNOSIS — R102 Pelvic and perineal pain: Secondary | ICD-10-CM | POA: Diagnosis not present

## 2020-12-09 DIAGNOSIS — Z9889 Other specified postprocedural states: Secondary | ICD-10-CM | POA: Diagnosis not present

## 2020-12-09 DIAGNOSIS — Z880 Allergy status to penicillin: Secondary | ICD-10-CM | POA: Diagnosis not present

## 2020-12-09 DIAGNOSIS — R109 Unspecified abdominal pain: Secondary | ICD-10-CM | POA: Diagnosis not present

## 2020-12-09 DIAGNOSIS — D259 Leiomyoma of uterus, unspecified: Secondary | ICD-10-CM | POA: Diagnosis not present

## 2020-12-09 DIAGNOSIS — N921 Excessive and frequent menstruation with irregular cycle: Secondary | ICD-10-CM | POA: Diagnosis not present

## 2020-12-09 DIAGNOSIS — R1031 Right lower quadrant pain: Secondary | ICD-10-CM | POA: Diagnosis not present

## 2020-12-10 DIAGNOSIS — Z880 Allergy status to penicillin: Secondary | ICD-10-CM | POA: Diagnosis not present

## 2020-12-10 DIAGNOSIS — D259 Leiomyoma of uterus, unspecified: Secondary | ICD-10-CM | POA: Diagnosis not present

## 2020-12-10 DIAGNOSIS — R102 Pelvic and perineal pain: Secondary | ICD-10-CM | POA: Diagnosis not present

## 2020-12-10 DIAGNOSIS — N921 Excessive and frequent menstruation with irregular cycle: Secondary | ICD-10-CM | POA: Diagnosis not present

## 2020-12-10 DIAGNOSIS — Z9889 Other specified postprocedural states: Secondary | ICD-10-CM | POA: Diagnosis not present

## 2020-12-21 DIAGNOSIS — D259 Leiomyoma of uterus, unspecified: Secondary | ICD-10-CM | POA: Diagnosis not present

## 2020-12-28 DIAGNOSIS — N938 Other specified abnormal uterine and vaginal bleeding: Secondary | ICD-10-CM | POA: Diagnosis not present

## 2020-12-28 DIAGNOSIS — R102 Pelvic and perineal pain: Secondary | ICD-10-CM | POA: Diagnosis not present

## 2020-12-28 DIAGNOSIS — N99820 Postprocedural hemorrhage and hematoma of a genitourinary system organ or structure following a genitourinary system procedure: Secondary | ICD-10-CM | POA: Diagnosis not present

## 2020-12-28 DIAGNOSIS — R9389 Abnormal findings on diagnostic imaging of other specified body structures: Secondary | ICD-10-CM | POA: Diagnosis not present

## 2020-12-28 DIAGNOSIS — G8918 Other acute postprocedural pain: Secondary | ICD-10-CM | POA: Diagnosis not present

## 2021-01-11 DIAGNOSIS — D259 Leiomyoma of uterus, unspecified: Secondary | ICD-10-CM | POA: Diagnosis not present

## 2021-01-20 DIAGNOSIS — R946 Abnormal results of thyroid function studies: Secondary | ICD-10-CM | POA: Diagnosis not present

## 2021-01-20 DIAGNOSIS — Z0000 Encounter for general adult medical examination without abnormal findings: Secondary | ICD-10-CM | POA: Diagnosis not present

## 2021-01-20 DIAGNOSIS — E7800 Pure hypercholesterolemia, unspecified: Secondary | ICD-10-CM | POA: Diagnosis not present

## 2021-01-20 DIAGNOSIS — E559 Vitamin D deficiency, unspecified: Secondary | ICD-10-CM | POA: Diagnosis not present

## 2021-01-20 DIAGNOSIS — E039 Hypothyroidism, unspecified: Secondary | ICD-10-CM | POA: Diagnosis not present

## 2021-02-08 DIAGNOSIS — D72819 Decreased white blood cell count, unspecified: Secondary | ICD-10-CM | POA: Diagnosis not present

## 2021-02-08 DIAGNOSIS — R946 Abnormal results of thyroid function studies: Secondary | ICD-10-CM | POA: Diagnosis not present

## 2021-02-08 DIAGNOSIS — Z0000 Encounter for general adult medical examination without abnormal findings: Secondary | ICD-10-CM | POA: Diagnosis not present

## 2021-02-08 DIAGNOSIS — E559 Vitamin D deficiency, unspecified: Secondary | ICD-10-CM | POA: Diagnosis not present

## 2021-08-09 ENCOUNTER — Other Ambulatory Visit: Payer: Self-pay | Admitting: Obstetrics

## 2021-08-09 DIAGNOSIS — Z87898 Personal history of other specified conditions: Secondary | ICD-10-CM

## 2021-09-24 ENCOUNTER — Ambulatory Visit
Admission: RE | Admit: 2021-09-24 | Discharge: 2021-09-24 | Disposition: A | Discharge status: Home / Self Care | Payer: BC Managed Care – PPO | Source: Ambulatory Visit | Attending: Obstetrics | Admitting: Obstetrics

## 2021-09-24 DIAGNOSIS — Z87898 Personal history of other specified conditions: Secondary | ICD-10-CM

## 2021-09-24 DIAGNOSIS — R922 Inconclusive mammogram: Secondary | ICD-10-CM | POA: Diagnosis not present

## 2021-12-20 IMAGING — US US BREAST*R* LIMITED INC AXILLA
1 series · 6 of 6 positions shown · non-contrast
Comparison: Previous exam(s).

CLINICAL DATA: Six-month follow-up for a probably benign mass in
the right breast. This was initially assessed with diagnostic
imaging on 09/05/2019, as a call back from screening. On that exam,
a benign cyst was discovered sonographically, as well as a
solid-appearing oval circumscribed mass at 11:30 o'clock, 3 cm the
nipple. Short-term follow-up was recommended for this mass.

EXAM:
ULTRASOUND OF THE RIGHT BREAST

[Series 1: us breast*right* limited inc axilla · 0.06mm/px · 6 of 6 slices shown]
[im 1/6]
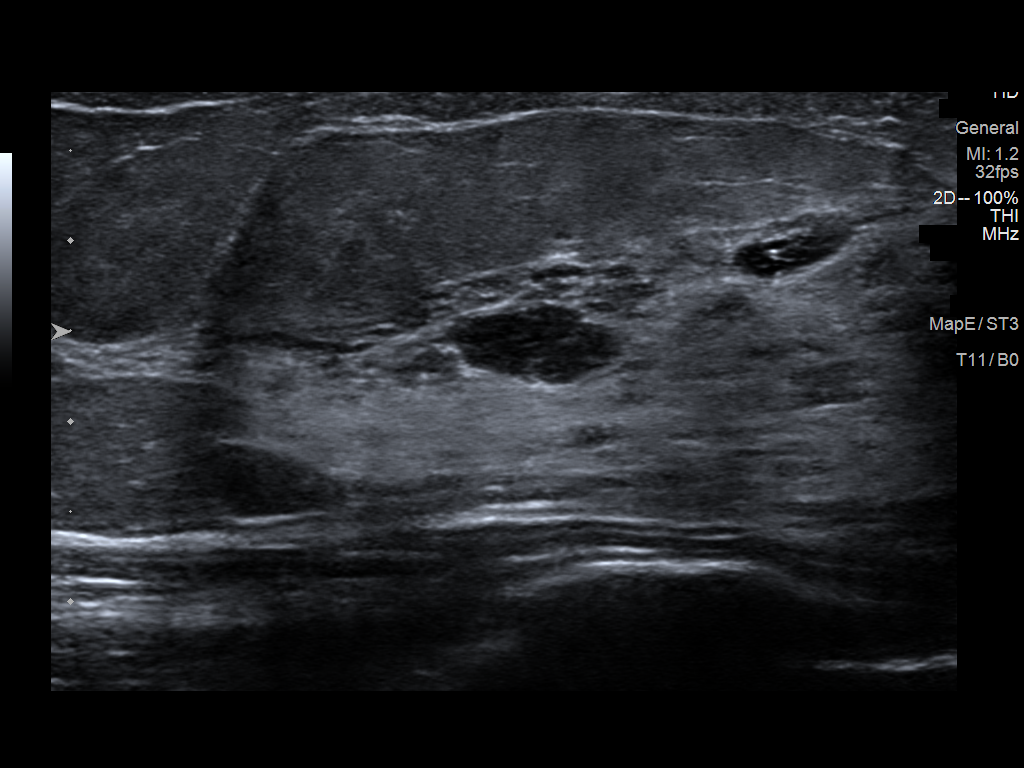
[im 2/6]
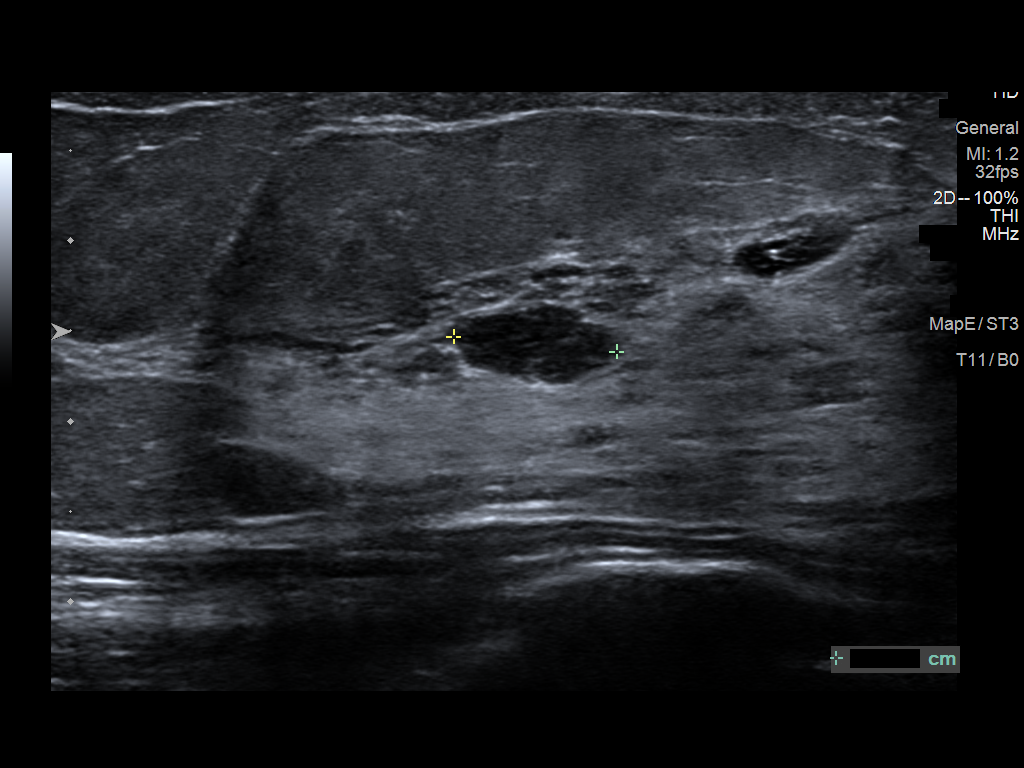
[im 3/6]
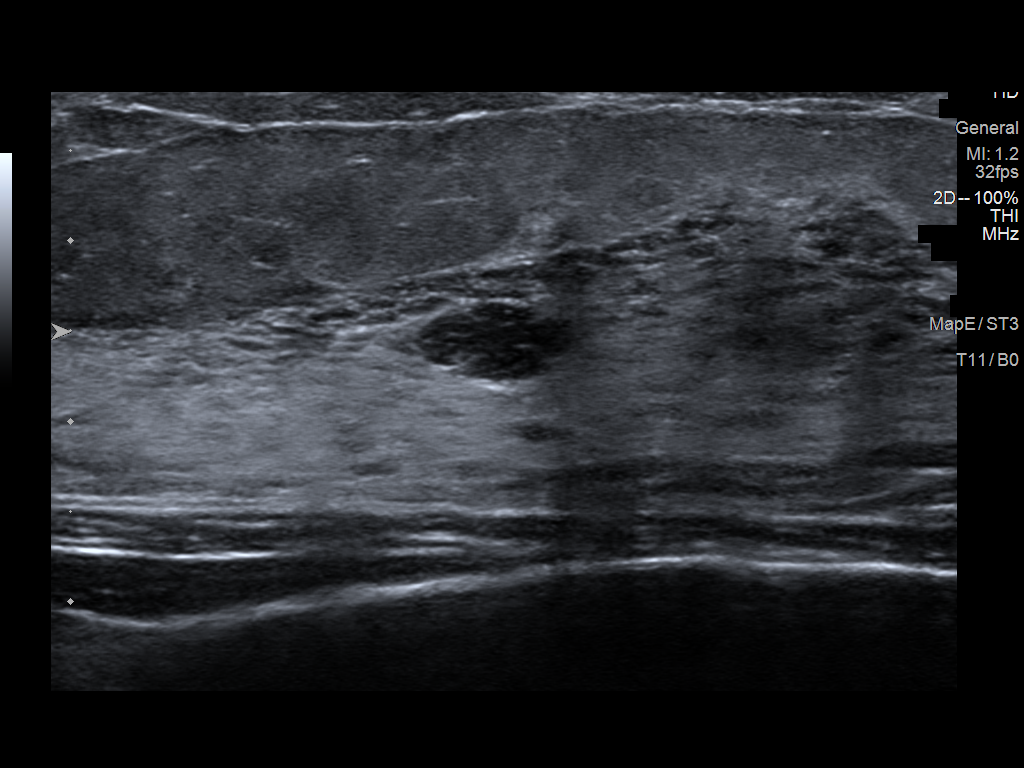
[im 4/6]
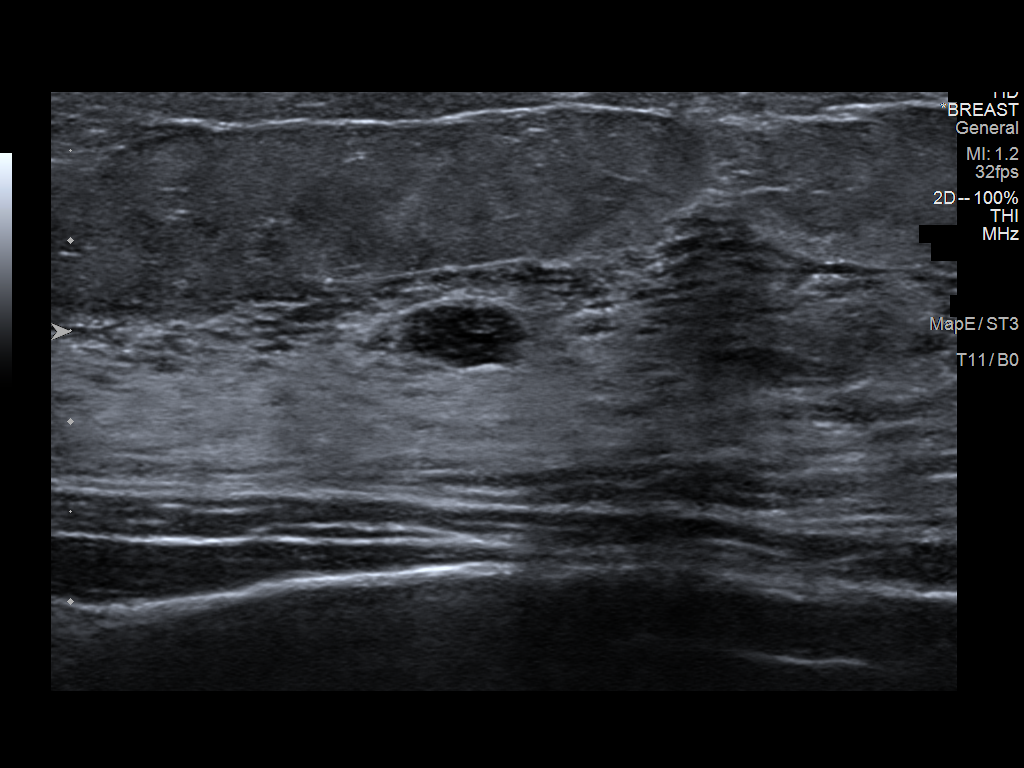
[im 5/6]
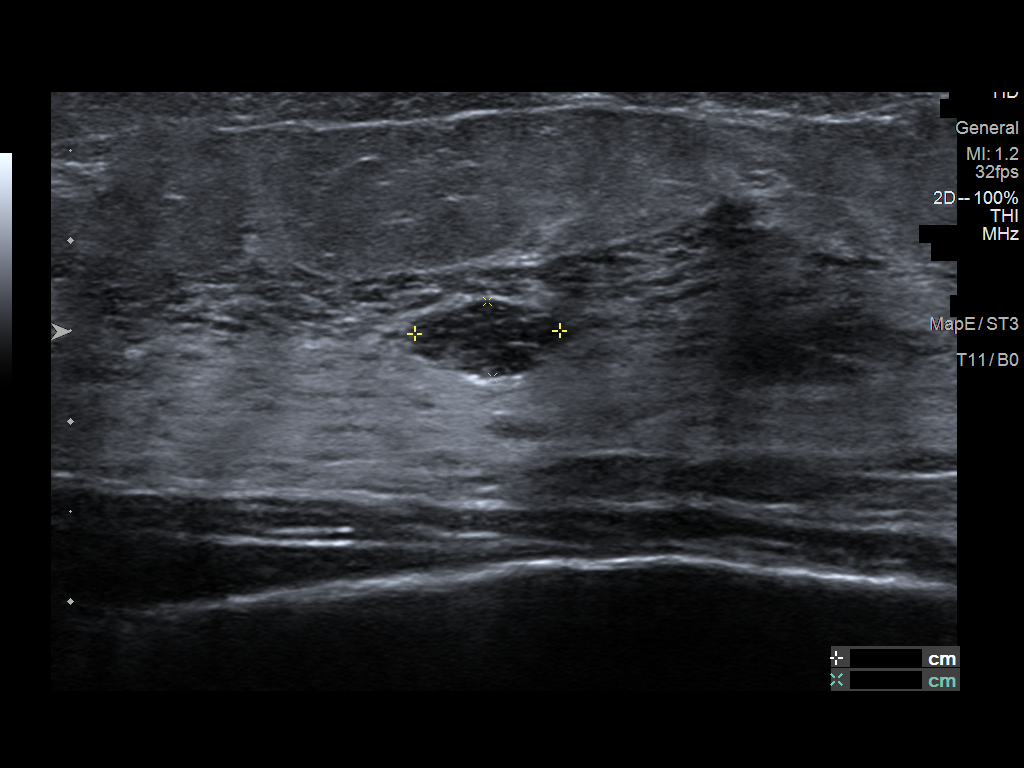
[im 6/6]
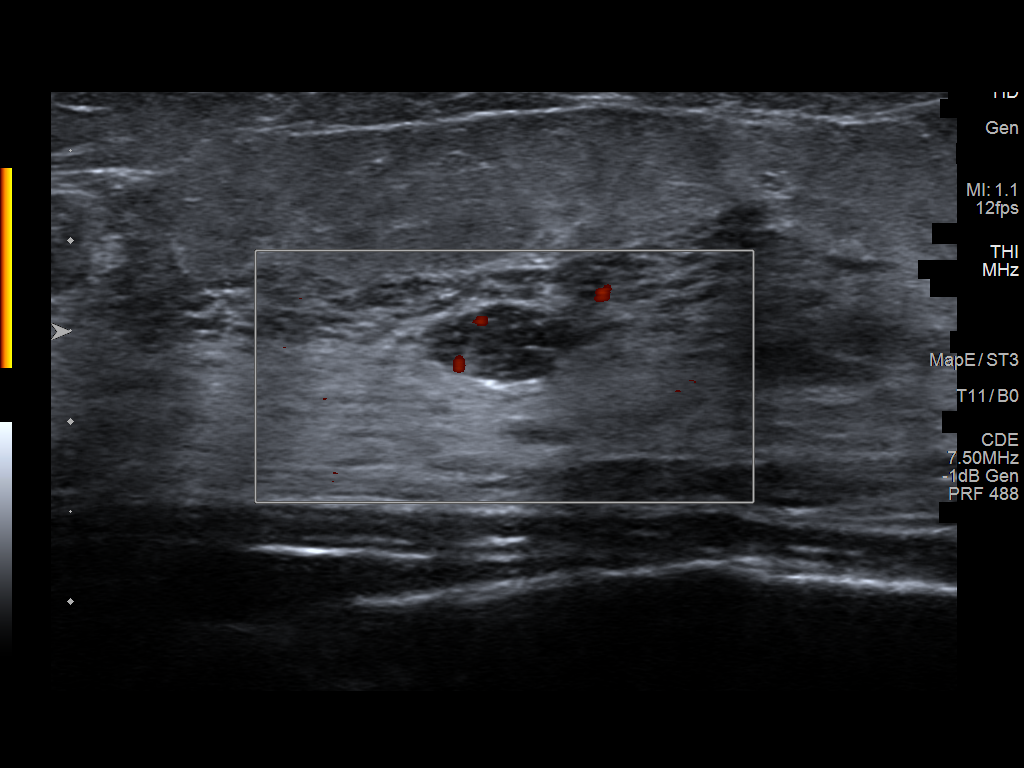

[6 of 6 positions shown; findings below may reference images not displayed]

FINDINGS: Targeted ultrasound is performed, showing an oval hypoechoic
circumscribed mass in the right breast at 11:30 o'clock, 3 cm from
the nipple, measuring 9 x 4 x 8 mm, without change from the prior
study.
IMPRESSION: 1. Probably benign mass in the right breast at 11:30 o'clock, most
likely a fibroadenoma, stable for 6 months. Additional short-term
follow-up recommended.

RECOMMENDATION:
Diagnostic mammography and right breast ultrasound in 6 months.

I have discussed the findings and recommendations with the patient.
If applicable, a reminder letter will be sent to the patient
regarding the next appointment.

BI-RADS CATEGORY  3: Probably benign.

## 2022-02-22 DIAGNOSIS — R946 Abnormal results of thyroid function studies: Secondary | ICD-10-CM | POA: Diagnosis not present

## 2022-02-22 DIAGNOSIS — Z0000 Encounter for general adult medical examination without abnormal findings: Secondary | ICD-10-CM | POA: Diagnosis not present

## 2022-02-22 DIAGNOSIS — R7309 Other abnormal glucose: Secondary | ICD-10-CM | POA: Diagnosis not present

## 2022-02-22 DIAGNOSIS — D72819 Decreased white blood cell count, unspecified: Secondary | ICD-10-CM | POA: Diagnosis not present

## 2022-03-10 DIAGNOSIS — L03211 Cellulitis of face: Secondary | ICD-10-CM | POA: Diagnosis not present

## 2022-03-11 ENCOUNTER — Encounter (HOSPITAL_BASED_OUTPATIENT_CLINIC_OR_DEPARTMENT_OTHER): Payer: Self-pay

## 2022-03-11 ENCOUNTER — Other Ambulatory Visit: Payer: Self-pay

## 2022-03-11 ENCOUNTER — Emergency Department (HOSPITAL_BASED_OUTPATIENT_CLINIC_OR_DEPARTMENT_OTHER)
Admission: EM | Admit: 2022-03-11 | Discharge: 2022-03-11 | Disposition: A | Discharge status: Home / Self Care | Payer: BC Managed Care – PPO | Attending: Emergency Medicine | Admitting: Emergency Medicine

## 2022-03-11 DIAGNOSIS — Z79899 Other long term (current) drug therapy: Secondary | ICD-10-CM | POA: Diagnosis not present

## 2022-03-11 DIAGNOSIS — E039 Hypothyroidism, unspecified: Secondary | ICD-10-CM | POA: Insufficient documentation

## 2022-03-11 DIAGNOSIS — H5789 Other specified disorders of eye and adnexa: Secondary | ICD-10-CM | POA: Diagnosis not present

## 2022-03-11 DIAGNOSIS — L03213 Periorbital cellulitis: Secondary | ICD-10-CM | POA: Insufficient documentation

## 2022-03-11 NOTE — ED Provider Notes (Signed)
[facility] EMERGENCY DEPARTMENT Provider Note   CSN: [id number] Arrival date & time: 03/11/22  1221     History  Chief Complaint  Patient presents with   Facial Swelling    Pamela Parker is a 46 y.o. female.  46 year old female presents with concern for right eye swelling.  Patient states that she woke up Monday morning and noted a small white bump on her right upper eyelid and was concerned something had stung her, did not experience swelling until yesterday morning upon waking.  Patient went to urgent care yesterday, was started on cefdinir and clindamycin which she started taking around 10 AM yesterday morning.  Patient woke up this morning with return of notable swelling to her right eye area which prompted her to come to the emergency room.  Swelling has somewhat improved at this time.  Denies pain with movement of the eye, visual disturbance.       Home Medications Prior to Admission medications   Medication Sig Start Date End Date Taking? Authorizing Provider  levothyroxine (SYNTHROID) 25 MCG tablet Take 25 mcg by mouth daily. 02/10/20   [provider]      Allergies    Penicillins    Review of Systems   Review of Systems Negative except as per HPI Physical Exam Updated Vital Signs BP 130/85 (BP Location: Right Arm)   Pulse 70   Temp 98.4 F (36.9 C) (Oral)   Resp 17   Ht '5\' 2"'$  (1.575 m)   Wt 54.4 kg   LMP 03/11/2022   SpO2 99%   BMI 21.95 kg/m  Physical Exam Vitals and nursing note reviewed.  Constitutional:      General: She is not in acute distress.    Appearance: She is well-developed. She is not diaphoretic.  HENT:     Head: Normocephalic and atraumatic.     Nose: Nose normal.     Mouth/Throat:     Mouth: Mucous membranes are moist.  Eyes:     Extraocular Movements: Extraocular movements intact.     Conjunctiva/sclera: Conjunctivae normal.     Pupils: Pupils are equal, round, and reactive to light.     Comments: Right  periorbital swelling with mild erythema to right upper eyelid.  Extraocular movements intact, pupils equal reactive, no proptosis.  Pulmonary:     Effort: Pulmonary effort is normal.  Musculoskeletal:     Cervical back: Neck supple.  Lymphadenopathy:     Cervical: No cervical adenopathy.  Skin:    General: Skin is warm and dry.  Neurological:     Mental Status: She is alert and oriented to person, place, and time.  Psychiatric:        Behavior: Behavior normal.     ED Results / Procedures / Treatments   Labs (all labs ordered are listed, but only abnormal results are displayed) Labs Reviewed - No data to display  EKG None  Radiology No results found.  Procedures Procedures    Medications Ordered in ED Medications - No data to display  ED Course/ Medical Decision Making/ A&P                           Medical Decision Making  This patient presents to the ED for concern of right periorbital swelling, redness, this involves an extensive number of treatment options, and is a complaint that carries with it a high risk of complications and morbidity.  The differential diagnosis includes  preseptal versus septal orbital cellulitis   Co morbidities that complicate the patient evaluation  Hypothyroid  Additional history obtained:  External records from outside source obtained and reviewed including recent visit to urgent care, started on clindamycin and cefdinir   Problem List / ED Course / Critical interventions / Medication management  46 year old female with concern for right periorbital swelling with erythema to the upper lid.  Images of patient's progress on her phone photos reviewed, lid appeared more swollen this morning upon waking, and explained to patient this is expected upon waking first thing in the morning and to gradually impressed throughout the day and over the next few days.  The redness seems to be gradually improving.  She is on the correct antibiotics  for treatment of her preseptal cellulitis.  Discussed symptoms for orbital cellulitis and reasons to return to emergency room.  At this time, does not seem to have an orbital cellulitis. I have reviewed the patients home medicines and have made adjustments as needed   Social Determinants of Health:  Has PCP, referred to ophthalmology for follow-up   Test / Admission - Considered:  Consider CT orbit however symptoms appear consistent with preseptal cellulitis, does not need CT for orbital cellulitis at this time.           Final Clinical Impression(s) / ED Diagnoses Final diagnoses:  Preseptal cellulitis    Rx / DC Orders ED Discharge Orders     None         Tacy Learn, PA-C 03/11/22 1809    Hayden Rasmussen, MD 03/11/22 [phone]

## 2022-03-11 NOTE — Discharge Instructions (Signed)
Continue with your antibiotics as previously prescribed. Can add a warm compress to the face for 20 minutes at a time. Referral to ophthalmology if needed, return to ER as discussed for worsening or concerning symptoms.

## 2022-03-11 NOTE — ED Triage Notes (Signed)
Patient c/o right eye swelling x Monday - was seen at [facility] but states has not improved. No visual changes.

## 2022-04-19 DIAGNOSIS — Z0000 Encounter for general adult medical examination without abnormal findings: Secondary | ICD-10-CM | POA: Diagnosis not present

## 2022-04-19 DIAGNOSIS — Z566 Other physical and mental strain related to work: Secondary | ICD-10-CM | POA: Diagnosis not present

## 2022-04-19 DIAGNOSIS — R0789 Other chest pain: Secondary | ICD-10-CM | POA: Diagnosis not present

## 2022-07-26 DIAGNOSIS — F330 Major depressive disorder, recurrent, mild: Secondary | ICD-10-CM | POA: Diagnosis not present

## 2022-08-03 DIAGNOSIS — F330 Major depressive disorder, recurrent, mild: Secondary | ICD-10-CM | POA: Diagnosis not present

## 2022-08-10 DIAGNOSIS — F330 Major depressive disorder, recurrent, mild: Secondary | ICD-10-CM | POA: Diagnosis not present

## 2022-08-11 ENCOUNTER — Other Ambulatory Visit: Payer: Self-pay | Admitting: Obstetrics

## 2022-08-11 DIAGNOSIS — Z1231 Encounter for screening mammogram for malignant neoplasm of breast: Secondary | ICD-10-CM

## 2022-08-24 DIAGNOSIS — F330 Major depressive disorder, recurrent, mild: Secondary | ICD-10-CM | POA: Diagnosis not present

## 2022-08-31 DIAGNOSIS — F330 Major depressive disorder, recurrent, mild: Secondary | ICD-10-CM | POA: Diagnosis not present

## 2022-09-14 DIAGNOSIS — F330 Major depressive disorder, recurrent, mild: Secondary | ICD-10-CM | POA: Diagnosis not present

## 2022-09-30 DIAGNOSIS — F330 Major depressive disorder, recurrent, mild: Secondary | ICD-10-CM | POA: Diagnosis not present

## 2022-10-05 ENCOUNTER — Ambulatory Visit
Admission: RE | Admit: 2022-10-05 | Discharge: 2022-10-05 | Disposition: A | Discharge status: Home / Self Care | Payer: BC Managed Care – PPO | Source: Ambulatory Visit | Attending: Obstetrics | Admitting: Obstetrics

## 2022-10-05 DIAGNOSIS — Z1231 Encounter for screening mammogram for malignant neoplasm of breast: Secondary | ICD-10-CM | POA: Diagnosis not present

## 2022-10-12 DIAGNOSIS — F330 Major depressive disorder, recurrent, mild: Secondary | ICD-10-CM | POA: Diagnosis not present

## 2022-11-09 DIAGNOSIS — F330 Major depressive disorder, recurrent, mild: Secondary | ICD-10-CM | POA: Diagnosis not present

## 2022-12-22 DIAGNOSIS — Z6822 Body mass index (BMI) 22.0-22.9, adult: Secondary | ICD-10-CM | POA: Diagnosis not present

## 2022-12-22 DIAGNOSIS — L300 Nummular dermatitis: Secondary | ICD-10-CM | POA: Diagnosis not present

## 2022-12-22 DIAGNOSIS — Z01419 Encounter for gynecological examination (general) (routine) without abnormal findings: Secondary | ICD-10-CM | POA: Diagnosis not present

## 2022-12-22 DIAGNOSIS — Z124 Encounter for screening for malignant neoplasm of cervix: Secondary | ICD-10-CM | POA: Diagnosis not present

## 2023-01-04 DIAGNOSIS — F330 Major depressive disorder, recurrent, mild: Secondary | ICD-10-CM | POA: Diagnosis not present

## 2023-01-17 DIAGNOSIS — L300 Nummular dermatitis: Secondary | ICD-10-CM | POA: Diagnosis not present

## 2023-01-25 DIAGNOSIS — F330 Major depressive disorder, recurrent, mild: Secondary | ICD-10-CM | POA: Diagnosis not present

## 2023-03-17 DIAGNOSIS — N644 Mastodynia: Secondary | ICD-10-CM | POA: Diagnosis not present

## 2023-04-04 DIAGNOSIS — L590 Erythema ab igne [dermatitis ab igne]: Secondary | ICD-10-CM | POA: Diagnosis not present

## 2023-04-12 DIAGNOSIS — J302 Other seasonal allergic rhinitis: Secondary | ICD-10-CM | POA: Diagnosis not present

## 2023-04-12 DIAGNOSIS — J029 Acute pharyngitis, unspecified: Secondary | ICD-10-CM | POA: Diagnosis not present

## 2023-05-12 DIAGNOSIS — R922 Inconclusive mammogram: Secondary | ICD-10-CM | POA: Diagnosis not present

## 2023-05-12 DIAGNOSIS — R92333 Mammographic heterogeneous density, bilateral breasts: Secondary | ICD-10-CM | POA: Diagnosis not present

## 2023-05-12 DIAGNOSIS — N644 Mastodynia: Secondary | ICD-10-CM | POA: Diagnosis not present

## 2023-09-01 ENCOUNTER — Other Ambulatory Visit: Payer: Self-pay | Admitting: Obstetrics

## 2023-09-01 DIAGNOSIS — Z1231 Encounter for screening mammogram for malignant neoplasm of breast: Secondary | ICD-10-CM

## 2023-10-09 ENCOUNTER — Ambulatory Visit
Admission: RE | Admit: 2023-10-09 | Discharge: 2023-10-09 | Disposition: A | Discharge status: Home / Self Care | Payer: BC Managed Care – PPO | Source: Ambulatory Visit | Attending: Obstetrics | Admitting: Obstetrics

## 2023-10-09 DIAGNOSIS — Z1231 Encounter for screening mammogram for malignant neoplasm of breast: Secondary | ICD-10-CM

## 2023-10-24 DIAGNOSIS — N951 Menopausal and female climacteric states: Secondary | ICD-10-CM | POA: Diagnosis not present

## 2023-10-24 DIAGNOSIS — Z1329 Encounter for screening for other suspected endocrine disorder: Secondary | ICD-10-CM | POA: Diagnosis not present

## 2023-10-24 DIAGNOSIS — E039 Hypothyroidism, unspecified: Secondary | ICD-10-CM | POA: Diagnosis not present

## 2023-10-26 DIAGNOSIS — N951 Menopausal and female climacteric states: Secondary | ICD-10-CM | POA: Diagnosis not present

## 2023-10-26 DIAGNOSIS — F39 Unspecified mood [affective] disorder: Secondary | ICD-10-CM | POA: Diagnosis not present

## 2023-10-26 DIAGNOSIS — R61 Generalized hyperhidrosis: Secondary | ICD-10-CM | POA: Diagnosis not present

## 2023-10-26 DIAGNOSIS — E039 Hypothyroidism, unspecified: Secondary | ICD-10-CM | POA: Diagnosis not present

## 2023-12-05 DIAGNOSIS — E039 Hypothyroidism, unspecified: Secondary | ICD-10-CM | POA: Diagnosis not present

## 2023-12-05 DIAGNOSIS — N951 Menopausal and female climacteric states: Secondary | ICD-10-CM | POA: Diagnosis not present

## 2023-12-07 DIAGNOSIS — N951 Menopausal and female climacteric states: Secondary | ICD-10-CM | POA: Diagnosis not present

## 2023-12-07 DIAGNOSIS — G479 Sleep disorder, unspecified: Secondary | ICD-10-CM | POA: Diagnosis not present

## 2023-12-07 DIAGNOSIS — E039 Hypothyroidism, unspecified: Secondary | ICD-10-CM | POA: Diagnosis not present

## 2023-12-07 DIAGNOSIS — M2550 Pain in unspecified joint: Secondary | ICD-10-CM | POA: Diagnosis not present

## 2023-12-25 ENCOUNTER — Encounter (HOSPITAL_BASED_OUTPATIENT_CLINIC_OR_DEPARTMENT_OTHER): Payer: Self-pay | Admitting: Emergency Medicine

## 2023-12-25 ENCOUNTER — Emergency Department (HOSPITAL_BASED_OUTPATIENT_CLINIC_OR_DEPARTMENT_OTHER)
Admission: EM | Admit: 2023-12-25 | Discharge: 2023-12-25 | Disposition: A | Discharge status: Home / Self Care | Attending: Emergency Medicine | Admitting: Emergency Medicine

## 2023-12-25 ENCOUNTER — Emergency Department (HOSPITAL_BASED_OUTPATIENT_CLINIC_OR_DEPARTMENT_OTHER)

## 2023-12-25 DIAGNOSIS — R0789 Other chest pain: Secondary | ICD-10-CM | POA: Insufficient documentation

## 2023-12-25 DIAGNOSIS — R079 Chest pain, unspecified: Secondary | ICD-10-CM | POA: Diagnosis not present

## 2023-12-25 LAB — CBC
HCT: 37.1 % (ref 36.0–46.0)
Hemoglobin: 12.3 g/dL (ref 12.0–15.0)
MCH: 29.7 pg (ref 26.0–34.0)
MCHC: 33.2 g/dL (ref 30.0–36.0)
MCV: 89.6 fL (ref 80.0–100.0)
Platelets: 257 10*3/uL (ref 150–400)
RBC: 4.14 MIL/uL (ref 3.87–5.11)
RDW: 13.5 % (ref 11.5–15.5)
WBC: 6.6 10*3/uL (ref 4.0–10.5)
nRBC: 0 % (ref 0.0–0.2)

## 2023-12-25 LAB — BASIC METABOLIC PANEL WITH GFR
Anion gap: 10 (ref 5–15)
BUN: 14 mg/dL (ref 6–20)
CO2: 24 mmol/L (ref 22–32)
Calcium: 9 mg/dL (ref 8.9–10.3)
Chloride: 102 mmol/L (ref 98–111)
Creatinine, Ser: 0.77 mg/dL (ref 0.44–1.00)
GFR, Estimated: >60 mL/min (ref >60)
Glucose, Bld: 78 mg/dL (ref 70–99)
Potassium: 3.9 mmol/L (ref 3.5–5.1)
Sodium: 136 mmol/L (ref 135–145)

## 2023-12-25 LAB — TROPONIN I (HIGH SENSITIVITY): Troponin I (High Sensitivity): 2 ng/L (ref <18)

## 2023-12-25 NOTE — Discharge Instructions (Signed)
 Based on your evaluation here is unlikely that this is due to a heart attack or blood clot in the lung.  Please return for worsening symptoms especially if they occur upon exertion or if you cough up blood or if you pass out.  Please follow-up with your family doctor in the office.  Take 4 over the counter ibuprofen tablets 3 times a day or 2 over-the-counter naproxen tablets twice a day for pain. Also take tylenol 1000mg (2 extra strength) four times a day.

## 2023-12-25 NOTE — ED Provider Triage Note (Signed)
 Emergency Medicine Provider Triage Evaluation Note  Pamela Parker , a 48 y.o. female  was evaluated in triage.  Pt complains of cp x 4 days left sided no sob , diaphoresis or nausea. Sharp. No risk factors. Wanted to "make sure " there is no problem with her heart. No estrogens or other rf for PE  Review of Systems  Positive: cp Negative: Fever   Physical Exam  BP (!) 180/88 (BP Location: Right Arm)   Pulse 93   Temp 97.7 F (36.5 C)   Resp 16   SpO2 100%  Gen:   Awake, no distress   Resp:  Normal effort  MSK:   Moves extremities without difficulty  Other:    Medical Decision Making  Medically screening exam initiated at 5:27 PM.  Appropriate orders placed.  Pamela Parker was informed that the remainder of the evaluation will be completed by another provider, this initial triage assessment does not replace that evaluation, and the importance of remaining in the ED until their evaluation is complete.  Chest pain   Pamela Captain, PA-C 12/25/23 1729

## 2023-12-25 NOTE — ED Triage Notes (Signed)
 Central chest pain Started 4 days ago

## 2024-01-01 NOTE — ED Provider Notes (Signed)
 [facility] AT [facility] Provider Note   CSN: [id number] Arrival date & time: 12/25/23  1704     History  Chief Complaint  Patient presents with   Chest Pain    Pamela Parker is a 48 y.o. female.  48 yo F with a cc of chest pain.  Going on for about 4 days.  Left sided, sharp.     Chest Pain      Home Medications Prior to Admission medications   Medication Sig Start Date End Date Taking? Authorizing Provider  levothyroxine (SYNTHROID) 25 MCG tablet Take 25 mcg by mouth daily. 02/10/20   [provider]      Allergies    Penicillins    Review of Systems   Review of Systems  Cardiovascular:  Positive for chest pain.    Physical Exam Updated Vital Signs BP (!) 164/78   Pulse 68   Temp 97.7 F (36.5 C)   Resp 17   LMP 12/12/2023   SpO2 100%  Physical Exam Vitals and nursing note reviewed.  Constitutional:      General: She is not in acute distress.    Appearance: She is well-developed. She is not diaphoretic.  HENT:     Head: Normocephalic and atraumatic.  Eyes:     Pupils: Pupils are equal, round, and reactive to light.  Cardiovascular:     Rate and Rhythm: Normal rate and regular rhythm.     Heart sounds: No murmur heard.    No friction rub. No gallop.  Pulmonary:     Effort: Pulmonary effort is normal.     Breath sounds: No wheezing or rales.  Abdominal:     General: There is no distension.     Palpations: Abdomen is soft.     Tenderness: There is no abdominal tenderness.  Musculoskeletal:        General: No tenderness.     Cervical back: Normal range of motion and neck supple.  Skin:    General: Skin is warm and dry.  Neurological:     Mental Status: She is alert and oriented to person, place, and time.  Psychiatric:        Behavior: Behavior normal.     ED Results / Procedures / Treatments   Labs (all labs ordered are listed, but only abnormal results are displayed) Labs Reviewed  BASIC  METABOLIC PANEL WITH GFR  CBC  TROPONIN I (HIGH SENSITIVITY)    EKG EKG Interpretation Date/Time:  Monday December 25 2023 17:21:31 EDT Ventricular Rate:  79 PR Interval:  140 QRS Duration:  78 QT Interval:  388 QTC Calculation: 444 R Axis:   84  Text Interpretation: Normal sinus rhythm Cannot rule out Anterior infarct , age undetermined Abnormal ECG No significant change since last tracing Confirmed by Melene Plan [phone]) on 12/25/2023 7:08:40 PM  Radiology No results found.  Procedures Procedures    Medications Ordered in ED Medications - No data to display  ED Course/ Medical Decision Making/ A&P                                 Medical Decision Making  48 yo F with a cc of chest pain.  Going on for about 4 days. Atypical in nature.  Trop negative.  CXR independently interpreted by me without focal infiltrate or pneumothorax.  Treat as musculoskeletal.  PCP follow up.    I have  discussed the diagnosis/risks/treatment options with the patient.  Evaluation and diagnostic testing in the emergency department does not suggest an emergent condition requiring admission or immediate intervention beyond what has been performed at this time.  They will follow up with PCP. We also discussed returning to the ED immediately if new or worsening sx occur. We discussed the sx which are most concerning (e.g., sudden worsening pain, fever, inability to tolerate by mouth) that necessitate immediate return. Medications administered to the patient during their visit and any new prescriptions provided to the patient are listed below.  Medications given during this visit Medications - No data to display   The patient appears reasonably screen and/or stabilized for discharge and I doubt any other medical condition or other [facility] requiring further screening, evaluation, or treatment in the ED at this time prior to discharge.          Final Clinical Impression(s) / ED Diagnoses Final diagnoses:   Nonspecific chest pain    Rx / DC Orders ED Discharge Orders     None         Melene Plan, DO 01/01/24 2258

## 2024-01-15 DIAGNOSIS — L718 Other rosacea: Secondary | ICD-10-CM | POA: Diagnosis not present

## 2024-01-15 DIAGNOSIS — L560 Drug phototoxic response: Secondary | ICD-10-CM | POA: Diagnosis not present

## 2024-01-15 DIAGNOSIS — L309 Dermatitis, unspecified: Secondary | ICD-10-CM | POA: Diagnosis not present

## 2024-01-15 DIAGNOSIS — L562 Photocontact dermatitis [berloque dermatitis]: Secondary | ICD-10-CM | POA: Diagnosis not present

## 2024-02-27 DIAGNOSIS — Z1331 Encounter for screening for depression: Secondary | ICD-10-CM | POA: Diagnosis not present

## 2024-02-27 DIAGNOSIS — Z01419 Encounter for gynecological examination (general) (routine) without abnormal findings: Secondary | ICD-10-CM | POA: Diagnosis not present

## 2024-04-04 DIAGNOSIS — F4320 Adjustment disorder, unspecified: Secondary | ICD-10-CM | POA: Diagnosis not present

## 2024-04-08 DIAGNOSIS — R946 Abnormal results of thyroid function studies: Secondary | ICD-10-CM | POA: Diagnosis not present

## 2024-04-08 DIAGNOSIS — D72819 Decreased white blood cell count, unspecified: Secondary | ICD-10-CM | POA: Diagnosis not present

## 2024-04-08 DIAGNOSIS — E559 Vitamin D deficiency, unspecified: Secondary | ICD-10-CM | POA: Diagnosis not present

## 2024-04-08 DIAGNOSIS — R7309 Other abnormal glucose: Secondary | ICD-10-CM | POA: Diagnosis not present

## 2024-04-08 DIAGNOSIS — Z79899 Other long term (current) drug therapy: Secondary | ICD-10-CM | POA: Diagnosis not present

## 2024-04-15 DIAGNOSIS — Z0000 Encounter for general adult medical examination without abnormal findings: Secondary | ICD-10-CM | POA: Diagnosis not present

## 2024-04-17 DIAGNOSIS — Z1211 Encounter for screening for malignant neoplasm of colon: Secondary | ICD-10-CM | POA: Diagnosis not present

## 2024-04-19 DIAGNOSIS — F4320 Adjustment disorder, unspecified: Secondary | ICD-10-CM | POA: Diagnosis not present

## 2024-05-09 DIAGNOSIS — F4320 Adjustment disorder, unspecified: Secondary | ICD-10-CM | POA: Diagnosis not present

## 2024-05-23 DIAGNOSIS — F4320 Adjustment disorder, unspecified: Secondary | ICD-10-CM | POA: Diagnosis not present

## 2024-06-06 DIAGNOSIS — F4320 Adjustment disorder, unspecified: Secondary | ICD-10-CM | POA: Diagnosis not present

## 2024-06-26 ENCOUNTER — Other Ambulatory Visit: Payer: Self-pay | Admitting: Medical Genetics

## 2024-06-27 DIAGNOSIS — F4320 Adjustment disorder, unspecified: Secondary | ICD-10-CM | POA: Diagnosis not present

## 2024-06-28 ENCOUNTER — Other Ambulatory Visit (HOSPITAL_COMMUNITY)
Admission: RE | Admit: 2024-06-28 | Discharge: 2024-06-28 | Disposition: A | Discharge status: Home / Self Care | Payer: Self-pay | Source: Ambulatory Visit | Attending: Medical Genetics | Admitting: Medical Genetics

## 2024-07-07 LAB — GENECONNECT MOLECULAR SCREEN: Genetic Analysis Overall Interpretation: NEGATIVE

## 2024-07-11 DIAGNOSIS — F4320 Adjustment disorder, unspecified: Secondary | ICD-10-CM | POA: Diagnosis not present

## 2024-08-14 DIAGNOSIS — L814 Other melanin hyperpigmentation: Secondary | ICD-10-CM | POA: Diagnosis not present

## 2024-08-14 DIAGNOSIS — D1801 Hemangioma of skin and subcutaneous tissue: Secondary | ICD-10-CM | POA: Diagnosis not present

## 2024-08-14 DIAGNOSIS — L821 Other seborrheic keratosis: Secondary | ICD-10-CM | POA: Diagnosis not present

## 2024-08-26 ENCOUNTER — Other Ambulatory Visit: Payer: Self-pay | Admitting: Obstetrics

## 2024-08-26 DIAGNOSIS — Z1231 Encounter for screening mammogram for malignant neoplasm of breast: Secondary | ICD-10-CM

## 2024-10-09 ENCOUNTER — Ambulatory Visit
Admission: RE | Admit: 2024-10-09 | Discharge: 2024-10-09 | Disposition: A | Discharge status: Home / Self Care | Source: Ambulatory Visit | Attending: Obstetrics | Admitting: Obstetrics

## 2024-10-09 DIAGNOSIS — Z1231 Encounter for screening mammogram for malignant neoplasm of breast: Secondary | ICD-10-CM
